# Patient Record
Sex: Female | Born: 2000 | Race: Black or African American | Hispanic: No | Marital: Single | Smoking: Light tobacco smoker
Health system: Southern US, Community
[De-identification: ages and names within clinical notes are randomized; demographics above are authoritative.]

---

## 2018-03-03 ENCOUNTER — Encounter: Payer: Self-pay | Admitting: Emergency Medicine

## 2018-03-03 ENCOUNTER — Other Ambulatory Visit: Payer: Self-pay

## 2018-03-03 ENCOUNTER — Emergency Department
Admission: EM | Admit: 2018-03-03 | Discharge: 2018-03-04 | Disposition: A | Discharge status: Home / Self Care | Payer: Managed Care, Other (non HMO) | Attending: Emergency Medicine | Admitting: Emergency Medicine

## 2018-03-03 DIAGNOSIS — R51 Headache: Secondary | ICD-10-CM | POA: Insufficient documentation

## 2018-03-03 DIAGNOSIS — R519 Headache, unspecified: Secondary | ICD-10-CM

## 2018-03-03 DIAGNOSIS — F17210 Nicotine dependence, cigarettes, uncomplicated: Secondary | ICD-10-CM | POA: Insufficient documentation

## 2018-03-03 DIAGNOSIS — R55 Syncope and collapse: Secondary | ICD-10-CM

## 2018-03-03 DIAGNOSIS — R42 Dizziness and giddiness: Secondary | ICD-10-CM | POA: Diagnosis not present

## 2018-03-03 LAB — CBC
HEMATOCRIT: 37.7 % (ref 35.0–47.0)
HEMOGLOBIN: 12.6 g/dL (ref 12.0–16.0)
MCH: 27.4 pg (ref 26.0–34.0)
MCHC: 33.4 g/dL (ref 32.0–36.0)
MCV: 82.1 fL (ref 80.0–100.0)
PLATELETS: 254 10*3/uL (ref 150–440)
RBC: 4.59 MIL/uL (ref 3.80–5.20)
RDW: 14.2 % (ref 11.5–14.5)
WBC: 9.1 10*3/uL (ref 3.6–11.0)

## 2018-03-03 LAB — BASIC METABOLIC PANEL
ANION GAP: 7 (ref 5–15)
BUN: 13 mg/dL (ref 6–20)
CHLORIDE: 105 mmol/L (ref 98–111)
CO2: 24 mmol/L (ref 22–32)
CREATININE: 0.94 mg/dL (ref 0.44–1.00)
Calcium: 8.9 mg/dL (ref 8.9–10.3)
GFR calc non Af Amer: >60 mL/min (ref >60)
Glucose, Bld: 96 mg/dL (ref 70–99)
POTASSIUM: 3.8 mmol/L (ref 3.5–5.1)
Sodium: 136 mmol/L (ref 135–145)

## 2018-03-03 LAB — URINALYSIS, COMPLETE (UACMP) WITH MICROSCOPIC
BILIRUBIN URINE: NEGATIVE
Bacteria, UA: NONE SEEN
Glucose, UA: NEGATIVE mg/dL
Hgb urine dipstick: NEGATIVE
KETONES UR: NEGATIVE mg/dL
LEUKOCYTES UA: NEGATIVE
Nitrite: NEGATIVE
PH: 8 (ref 5.0–8.0)
Protein, ur: NEGATIVE mg/dL
Specific Gravity, Urine: 1.024 (ref 1.005–1.030)

## 2018-03-03 LAB — TROPONIN I

## 2018-03-03 LAB — POCT PREGNANCY, URINE: Preg Test, Ur: NEGATIVE

## 2018-03-03 MED ORDER — KETOROLAC TROMETHAMINE 30 MG/ML IJ SOLN
30.0000 mg | Freq: Once | INTRAMUSCULAR | Status: AC
  Administered 2018-03-04: 30 mg via INTRAVENOUS
  Filled 2018-03-03: qty 1

## 2018-03-03 MED ORDER — SODIUM CHLORIDE 0.9 % IV BOLUS
1000.0000 mL | Freq: Once | INTRAVENOUS | Status: AC
  Administered 2018-03-04: 1000 mL via INTRAVENOUS

## 2018-03-03 MED ORDER — METOCLOPRAMIDE HCL 5 MG/ML IJ SOLN
10.0000 mg | Freq: Once | INTRAMUSCULAR | Status: AC
  Administered 2018-03-04: 10 mg via INTRAVENOUS
  Filled 2018-03-03: qty 2

## 2018-03-03 MED ORDER — DIPHENHYDRAMINE HCL 50 MG/ML IJ SOLN
25.0000 mg | Freq: Once | INTRAMUSCULAR | Status: AC
  Administered 2018-03-04: 25 mg via INTRAVENOUS
  Filled 2018-03-03: qty 1

## 2018-03-03 NOTE — ED Notes (Signed)
Pt states that she had the feeling that she was going to pass out today. She is a Consulting civil engineerstudent at [employer]. Pt states that she was outside a lot today for orientation. Pt eventually did pass out. She also passed out a month ago for the first time. Pt reports a bad HA on the right side of her head. Pt is alert and oriented x 4. Family  Member at bedside.

## 2018-03-03 NOTE — ED Triage Notes (Signed)
Pt arrived to the ED accompanied by friend for complaints of "passing out and headache." Pt reports that she was at school sitting down when she felt dizzy and passed out, witnessed by a friend. Pt reports that she had "passed out the previous week" and thought that it was dehydration. Pt denies hitting her head or falling due to the syncopal episode. Pt is AOx4 in no apparent distress speaking in complete sentences with no neurological deficiencies.

## 2018-03-03 NOTE — ED Notes (Signed)
Sherilyn CooterHenry RN presented the case to Dr. Derrill KayGoodman, he did not recommend a CT at this time.

## 2018-03-04 ENCOUNTER — Emergency Department: Payer: Managed Care, Other (non HMO)

## 2018-03-04 NOTE — ED Provider Notes (Signed)
[facility] Emergency Department Provider Note   ____________________________________________   First MD Initiated Contact with Patient 03/03/18 2347     (approximate)  I have reviewed the triage vital signs and the nursing notes.   HISTORY  Chief Complaint Loss of Consciousness and Headache    HPI Beverly Mccarthy is a 17 y.o. female who comes into the hospital today after a syncopal episode.  She reports that she was in an orientation session and started feeling bad.  She was very dizzy.  When she left she called her mom and her mom told her to sit down.  She reports though that when she sat down she passed out a couple of times.  She states that it was for short amount of time because she woke right back up but then went out again.  The patient states that they had been doing some team building activities outside but she was not doing anything overly strenuous.  The patient states that now she has a right-sided headache.  She states that it was a 10 out of 10 in intensity and has subsided but is coming back.  The patient states that she ate well today.  She passed out one month ago and states that she was told it was due to dehydration.  She denies a history of headaches.  The patient also denies any chest pain or shortness of breath.  The patient does smoke marijuana and states that she smoked last yesterday.  She is here today for evaluation.   History reviewed. No pertinent past medical history.  There are no active problems to display for this patient.   History reviewed. No pertinent surgical history.  Prior to Admission medications   Not on File    Allergies Other  History reviewed. No pertinent family history.  Social History Social History   Tobacco Use  . Smoking status: Light Tobacco Smoker  . Smokeless tobacco: Never Used  Substance Use Topics  . Alcohol use: Yes  . Drug use: Never    Review of Systems  Constitutional: No  fever/chills Eyes: No visual changes. ENT: No sore throat. Cardiovascular: Denies chest pain. Respiratory: Denies shortness of breath. Gastrointestinal: No abdominal pain.  No nausea, no vomiting.  No diarrhea.  No constipation. Genitourinary: Negative for dysuria. Musculoskeletal: Negative for back pain. Skin: Negative for rash. Neurological: Headache, dizziness, syncope.   ____________________________________________   PHYSICAL EXAM:  VITAL SIGNS: ED Triage Vitals  Enc Vitals Group     BP 03/03/18 2031 (!) 142/88     Pulse Rate 03/03/18 2031 95     Resp 03/03/18 2031 18     Temp 03/03/18 2031 98.3 F (36.8 C)     Temp Source 03/03/18 2031 Oral     SpO2 03/03/18 2031 100 %     Weight 03/03/18 2033 157 lb (71.2 kg)     Height 03/03/18 2033 5\' 7"  (1.702 m)     Head Circumference --      Peak Flow --      Pain Score 03/03/18 2032 5     Pain Loc --      Pain Edu? --      Excl. in GC? --     Constitutional: Alert and oriented. Well appearing and in moderate distress. Eyes: Conjunctivae are normal. PERRL. EOMI. Head: Atraumatic. Nose: No congestion/rhinnorhea. Mouth/Throat: Mucous membranes are moist.  Oropharynx non-erythematous. Cardiovascular: Normal rate, regular rhythm. Grossly normal heart sounds.  Good peripheral circulation. Respiratory: Normal  respiratory effort.  No retractions. Lungs CTAB. Gastrointestinal: Soft and nontender. No distention.  Positive bowel sounds Musculoskeletal: No lower extremity tenderness nor edema.   Neurologic:  Normal speech and language.  Cranial nerves II through XII are grossly intact with no focal motor neuro deficit Skin:  Skin is warm, dry and intact.  Psychiatric: Mood and affect are normal.   ____________________________________________   LABS (all labs ordered are listed, but only abnormal results are displayed)  Labs Reviewed  URINALYSIS, COMPLETE (UACMP) WITH MICROSCOPIC - Abnormal; Notable for the following components:       Result Value   Color, Urine YELLOW (*)    APPearance CLEAR (*)    All other components within normal limits  BASIC METABOLIC PANEL  CBC  TROPONIN I  POCT PREGNANCY, URINE   ____________________________________________  EKG  ED ECG REPORT I, Rebecka ApleyWebster,  Austin Pongratz P, the attending physician, personally viewed and interpreted this ECG.   Date: 03/04/2018  EKG Time: 2033  Rate: 101  Rhythm: sinus tachycardia with occasional PVC's  Axis: normal  Intervals:none  ST&T Change: none  ____________________________________________  RADIOLOGY  ED MD interpretation:  CT head: Negative noncontrasted CT appearance of the brain.  Official radiology report(s): Ct Head Wo Contrast  Result Date: 03/04/2018 CLINICAL DATA:  Syncope EXAM: CT HEAD WITHOUT CONTRAST TECHNIQUE: Contiguous axial images were obtained from the base of the skull through the vertex without intravenous contrast. COMPARISON:  None. FINDINGS: Brain: No evidence of acute infarction, hemorrhage, hydrocephalus, extra-axial collection or mass lesion/mass effect. Vascular: No hyperdense vessel or unexpected calcification. Skull: Normal. Negative for fracture or focal lesion. Sinuses/Orbits: No acute finding. Other: None IMPRESSION: Negative non contrasted CT appearance of the brain Electronically Signed   By: Jasmine PangKim  Fujinaga M.D.   On: 03/04/2018 02:15    ____________________________________________   PROCEDURES  Procedure(s) performed: None  Procedures  Critical Care performed: No  ____________________________________________   INITIAL IMPRESSION / ASSESSMENT AND PLAN / ED COURSE  As part of my medical decision making, I reviewed the following data within the electronic MEDICAL RECORD NUMBER Notes from prior ED visits and [place] Controlled Substance Database   This is a 17 year old female who comes into the hospital today with a syncopal episode dizziness and a headache.  The patient did spend multiple hours outside in the  heat but reports that she was not doing strenuous activity.  My differential diagnosis includes dizziness, vertigo, dehydration, migraine, intracranial pathology, acute coronary syndrome.  We did check some blood work on the patient to include a CBC, BMP troponin and a urinalysis.  The patient's blood work and urine are unremarkable.  I will give the patient a liter of normal saline as well as some Reglan, Benadryl and Toradol.  The patient will receive a CT scan of her head looking for possible pathology.  The patient states that her dizziness is gone at this time.  She will be reassessed when she is received her medications and I have received the results of her CT.  After the medication the patient was feeling much improved.  I will discharge the patient to home and encouraged to follow-up with her primary care physician.  The patient has no further questions or concerns.      ____________________________________________   FINAL CLINICAL IMPRESSION(S) / ED DIAGNOSES  Final diagnoses:  Syncope, unspecified syncope type  Dizziness  Acute nonintractable headache, unspecified headache type     ED Discharge Orders    None  Note:  This document was prepared using Dragon voice recognition software and may include unintentional dictation errors.    Rebecka Apley, MD 03/04/18 [phone]

## 2018-03-04 NOTE — Discharge Instructions (Addendum)
Please follow-up with the acute care clinic or your primary care physician.  Please make sure you are drinking lots of fluid and staying hydrated.  Please return with any worsening symptoms or any other concerns.

## 2019-10-02 IMAGING — CT CT HEAD W/O CM
3 series · 16 of 47 positions shown, 19 images · non-contrast
Comparison: None.

CLINICAL DATA: Syncope

EXAM:
CT HEAD WITHOUT CONTRAST
TECHNIQUE: Contiguous axial images were obtained from the base of the skull
through the vertex without intravenous contrast.

[Series 2: head wo · axial · 0.42mm/px · z∈[-209,-84]mm · 10 of 30 slices shown, 13 images]
[im 3/30  brain]
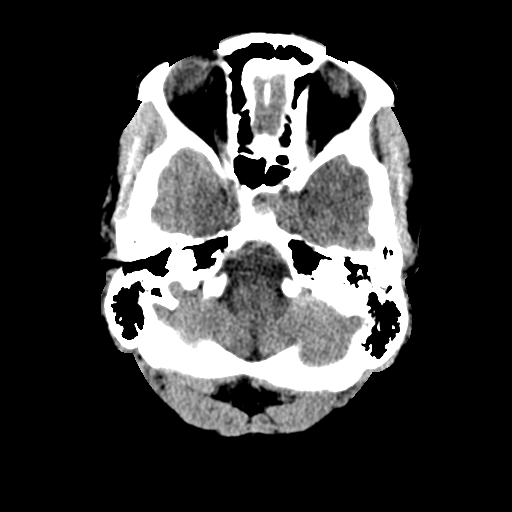
[im 3/30  bone]
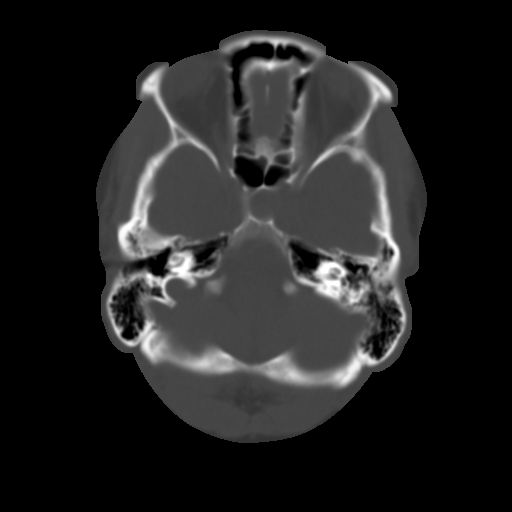
[im 6/30  brain]
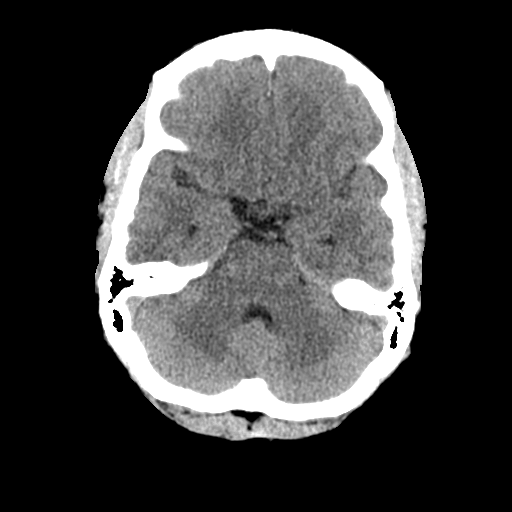
[im 9/30  brain]
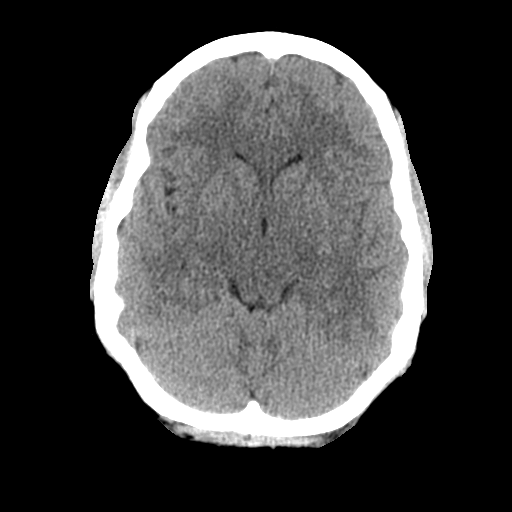
[im 11/30  brain]
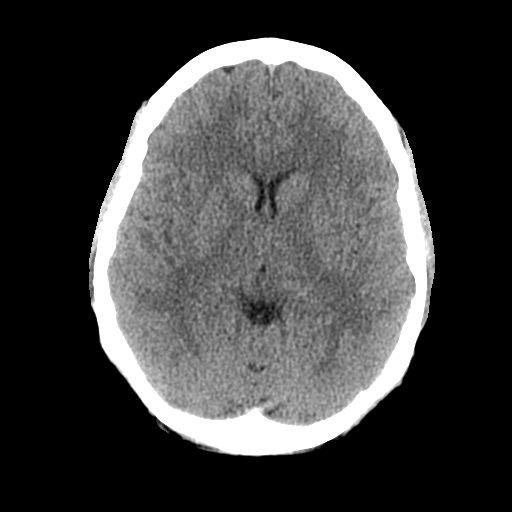
[im 14/30  brain]
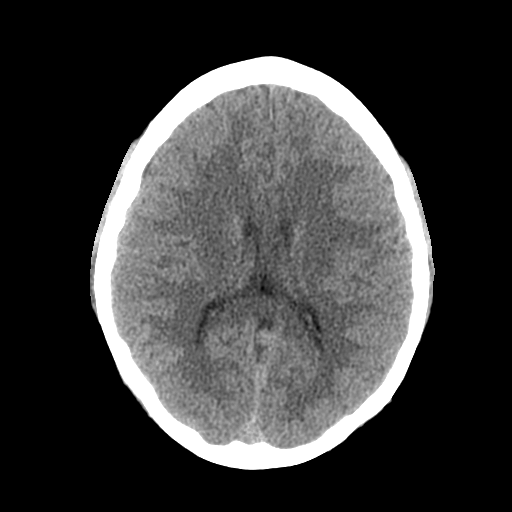
[im 14/30  bone]
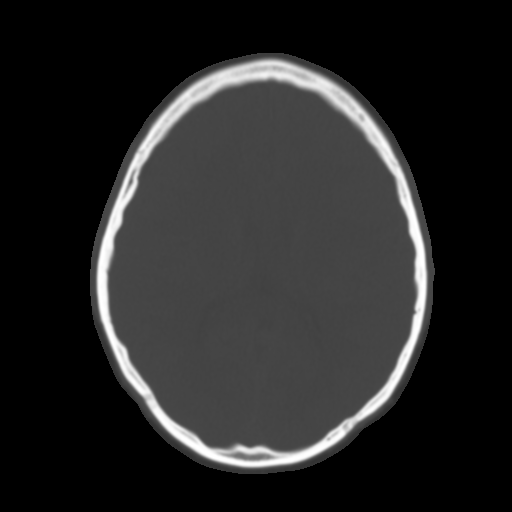
[im 17/30  brain]
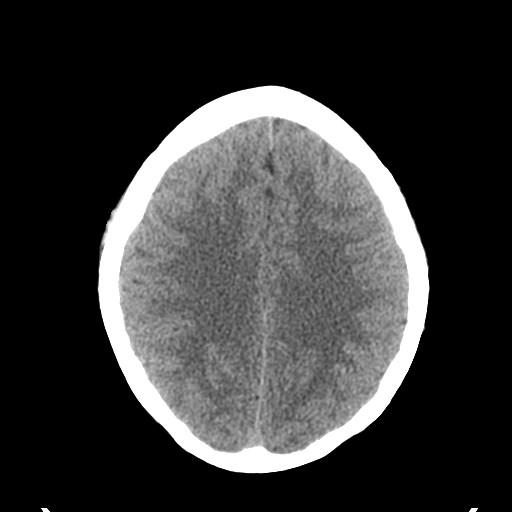
[im 20/30  brain]
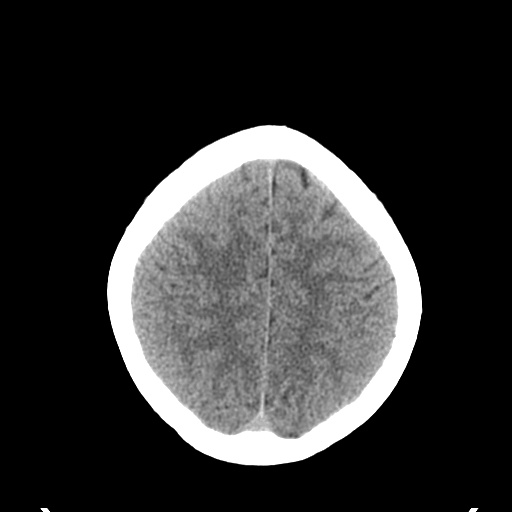
[im 23/30  brain]
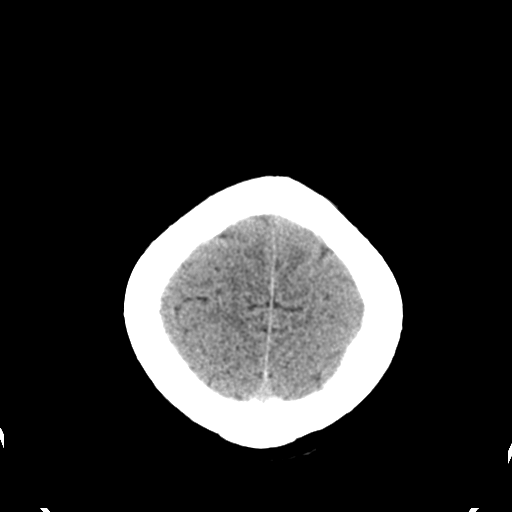
[im 25/30  brain]
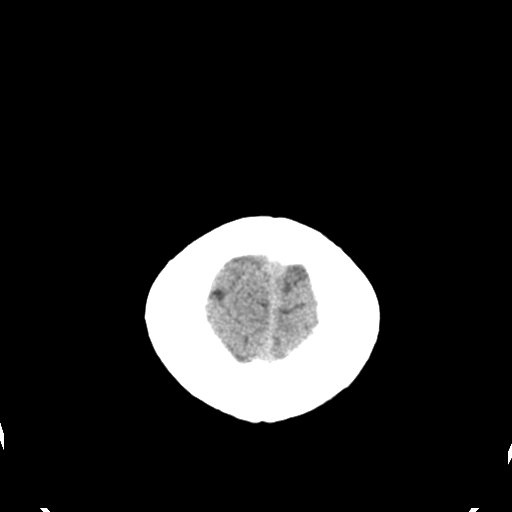
[im 25/30  bone]
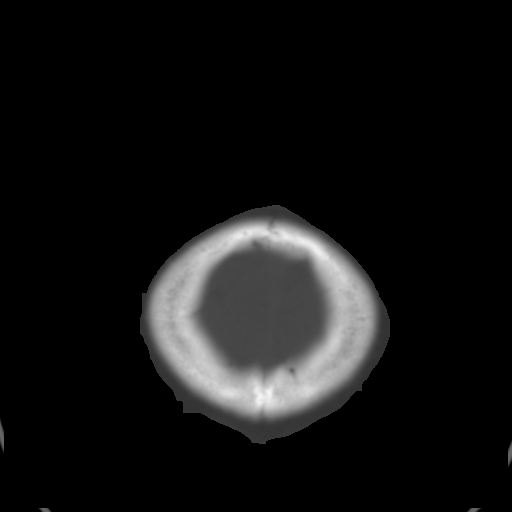
[im 28/30  brain]
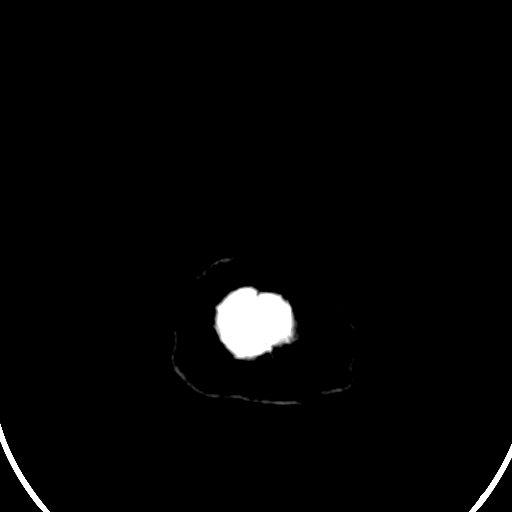

[Series 4: coronal soft tissue · coronal · 0.28mm/px · 3 of 61 slices shown]
[im 21/61  brain]
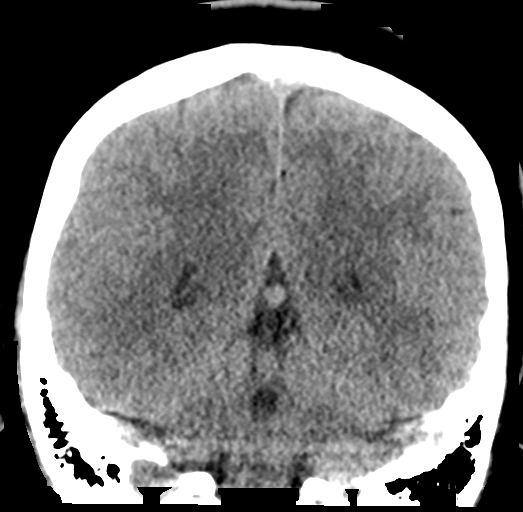
[im 27/61  brain]
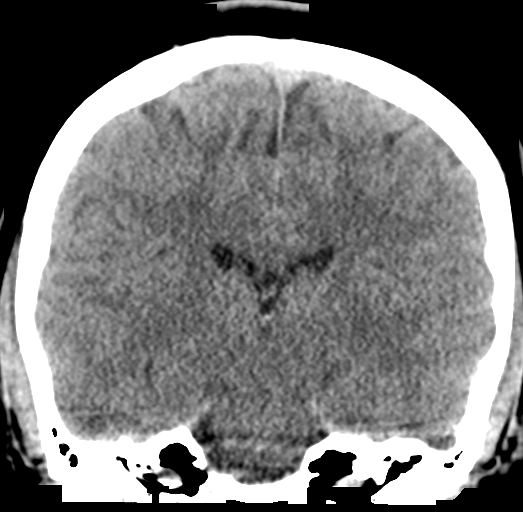
[im 34/61  brain]
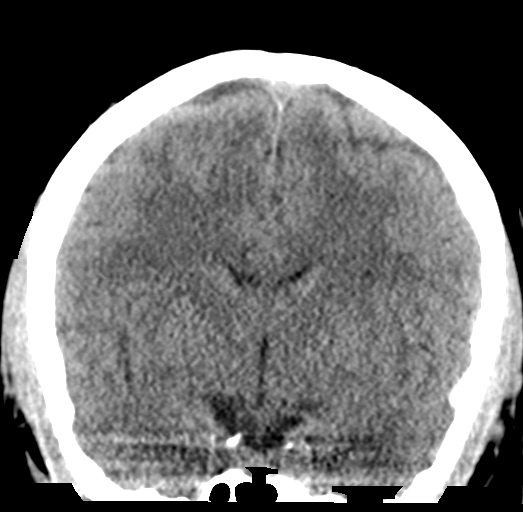

[Series 5: sagittal soft tissue · sagittal · 0.28mm/px · 3 of 50 slices shown]
[im 17/50  brain]
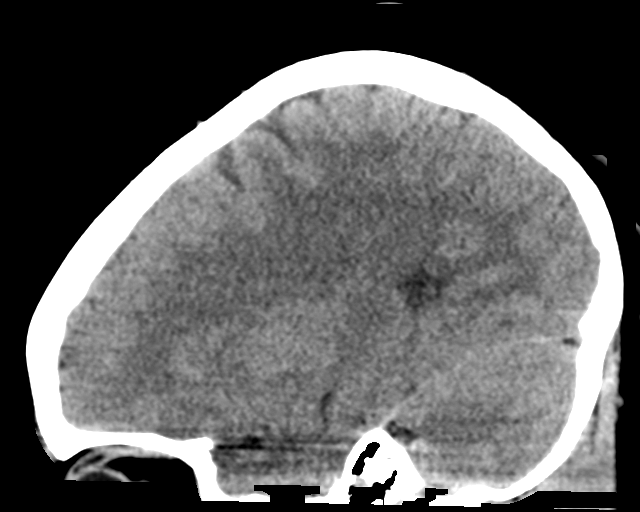
[im 25/50  brain]
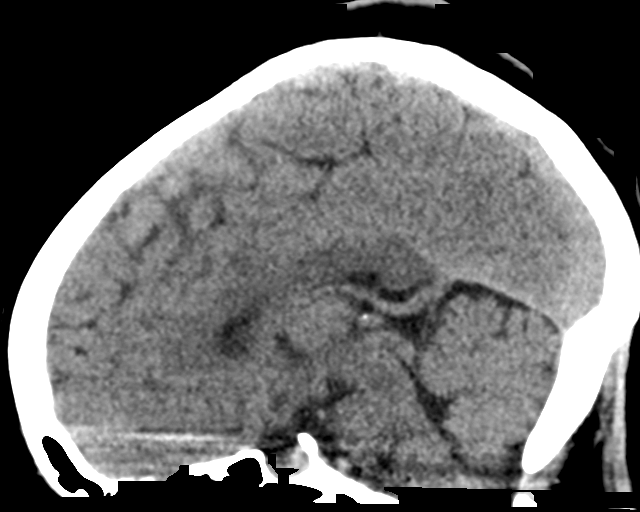
[im 33/50  brain]
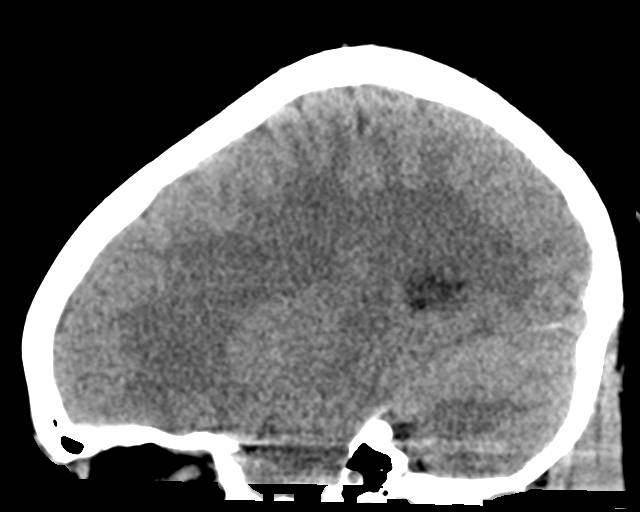

[16 of 47 positions shown; findings below may reference images not displayed]

FINDINGS: Brain: No evidence of acute infarction, hemorrhage, hydrocephalus,
extra-axial collection or mass lesion/mass effect.

Vascular: No hyperdense vessel or unexpected calcification.

Skull: Normal. Negative for fracture or focal lesion.

Sinuses/Orbits: No acute finding.

Other: None
IMPRESSION: Negative non contrasted CT appearance of the brain

## 2019-10-09 ENCOUNTER — Ambulatory Visit: Payer: Managed Care, Other (non HMO) | Attending: Internal Medicine

## 2019-10-09 DIAGNOSIS — Z23 Encounter for immunization: Secondary | ICD-10-CM

## 2019-10-09 NOTE — Progress Notes (Signed)
   Covid-19 Vaccination Clinic  Name:  Beverly Mccarthy    MRN: [id number] DOB: 08-18-2000  10/09/2019  [name] was observed post Covid-19 immunization for 15 minutes without incident. She was provided with Vaccine Information Sheet and instruction to access the V-Safe system.   [name] was instructed to call 911 with any severe reactions post vaccine: Marland Kitchen Difficulty breathing  . Swelling of face and throat  . A fast heartbeat  . A bad rash all over body  . Dizziness and weakness   Immunizations Administered    Name Date Dose VIS Date Route   Pfizer COVID-19 Vaccine 10/09/2019  2:27 PM 0.3 mL 06/26/2019 Intramuscular   Manufacturer: [employer], [employer]   Lot: [id number]   NDC: [address]-0

## 2019-10-30 ENCOUNTER — Ambulatory Visit: Payer: Managed Care, Other (non HMO) | Attending: Internal Medicine

## 2019-10-30 DIAGNOSIS — Z23 Encounter for immunization: Secondary | ICD-10-CM

## 2019-10-30 NOTE — Progress Notes (Signed)
   Covid-19 Vaccination Clinic  Name:  Beverly Mccarthy    MRN: [id number] DOB: 03-Nov-2000  10/30/2019  [name] was observed post Covid-19 immunization for 30 minutes based on pre-vaccination screening without incident. She was provided with Vaccine Information Sheet and instruction to access the V-Safe system.   [name] was instructed to call 911 with any severe reactions post vaccine: Marland Kitchen Difficulty breathing  . Swelling of face and throat  . A fast heartbeat  . A bad rash all over body  . Dizziness and weakness   Immunizations Administered    Name Date Dose VIS Date Route   Pfizer COVID-19 Vaccine 10/30/2019  2:23 PM 0.3 mL 06/26/2019 Intramuscular   Manufacturer: [employer], [employer]   Lot: [id number]   NDC: [address]-0
